# Patient Record
Sex: Male | Born: 1990 | Race: Black or African American | Hispanic: No | Marital: Single | Smoking: Current every day smoker
Health system: Southern US, Community
[De-identification: ages and names within clinical notes are randomized; demographics above are authoritative.]

## PROBLEM LIST (undated history)

## (undated) ENCOUNTER — Ambulatory Visit: Admission: EM | Payer: Self-pay | Source: Home / Self Care

---
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
## 2010-09-01 ENCOUNTER — Encounter: Payer: Self-pay | Admitting: Emergency Medicine

## 2010-12-24 ENCOUNTER — Emergency Department (HOSPITAL_COMMUNITY)
Admission: EM | Admit: 2010-12-24 | Discharge: 2010-12-24 | Disposition: A | Discharge status: Home / Self Care | Payer: Self-pay | Attending: Emergency Medicine | Admitting: Emergency Medicine

## 2010-12-24 DIAGNOSIS — J020 Streptococcal pharyngitis: Secondary | ICD-10-CM | POA: Insufficient documentation

## 2010-12-27 NOTE — Op Note (Signed)
Kenneth George, Kenneth George              ACCOUNT NO.:  [id number]   MEDICAL RECORD NO.:  [id number]          PATIENT TYPE:  INP   LOCATION:  1506                         FACILITY:  [facility]   PHYSICIAN:  Burnard Bunting, M.D.    DATE OF BIRTH:  1990/08/31   DATE OF PROCEDURE:  06/27/2005  DATE OF DISCHARGE:                                 OPERATIVE REPORT   PREOPERATIVE DIAGNOSIS:  Left complex proximal tibial plateau fracture and  growth plate fracture.   POSTOPERATIVE DIAGNOSIS:  Left complex proximal tibial plateau fracture and  growth plate fracture.   PROCEDURE:  Open reduction and internal fixation of complex bicondylar type  tibial plateau fracture.   SURGEON:  Burnard Bunting, M.D.   ASSISTANT:  Gabriel Earing, M.D.   ANESTHESIA:  General endotracheal anesthesia   ESTIMATED BLOOD LOSS:  50 mL.   DRAIN:  Hemovac x1.   TOURNIQUET TIME:  Two hours at 300 mmHg.   DESCRIPTION OF PROCEDURE:  Patient brought to the operating room where  general endotracheal anesthesia was induced.  Preoperative IV antibiotics  were administered.  Left leg was prepped with DuraPrep solution and draped  in a sterile manner.  Operative field was covered with Ioban.  Leg was  elevated and exsanguinated with esmarch wrap and tourniquet was inflated.  Anterior depression of the knee was utilized.  Anterior incision was  centered over the main fracture fragment which was palpable.  Skin and  subcutaneous tissue was sharply divided.  The knee joint was opened through  the fracture of the tibial tubercle and proximal tibial plateau.  Irrigation  was performed.  Fibrous tissue and clot was removed from the fractured  edges.  Under direct visualization, the fracture of the shaft and the main  posterior fragment was reduced and held.  A 4.5 lag screw was then placed  from distal anterior in the shaft to proximal posterior in the first  fragment.  This screw was drilled under direct visualization with good  compression achieved.  Following fixation of the shaft to the posterior  fragment, the plate was applied.  The anterior fragment was held in position  with two K-wires and the plate was applied to the reduced articular surface.  Correct reduction was confirmed in the AP and lateral planes under  fluoroscopic guidance.  Following plate fixation, the construct was found to  be stable.  Two screws were then placed through the tibial tubercle fragment  across the shaft and into the posterior fragment with good purchase  achieved.  Again, aiming toward the posterior aspect and then the screws  were placed over guidewires with the knee under fluoroscopic guidance.  A  second 4.5 lag screw was then placed from the shaft into the posterior  fragment from the anterior medial tibial shaft.  One screw was then placed  proximal to the plate across the two fragments proximally.  The entire  construct was checked under fluoroscopic guidance in the AP and lateral  planes and good reduction and safe placement of the screws was confirmed.  At this time, tourniquet was released.  Bleeding  points controlled using  electrocautery.  Soft tissue  coverage was achieved over the plate using 0 Vicryl suture.  Compartments  were soft and viable.  Skin was closed using interrupted inverted 2-0 Vicryl  sutures followed by skin staples.  Bulky dressing and knee immobilizer was  placed.  The patient tolerated the procedure well without immediate  complications.           ______________________________  G. Dorene Grebe, M.D.     GSD/MEDQ  D:  07/01/2005  T:  07/01/2005  Job:  [id number]

## 2010-12-27 NOTE — Discharge Summary (Signed)
NAMEBREKEN, Kenneth George              ACCOUNT NO.:  [id number]   MEDICAL RECORD NO.:  [id number]          PATIENT TYPE:  INP   LOCATION:  6153                         FACILITY:  [facility]   PHYSICIAN:  Orie Rout, M.D.DATE OF BIRTH:  1991-03-15   DATE OF ADMISSION:  08/24/2005  DATE OF DISCHARGE:  08/27/2005                                 DISCHARGE SUMMARY   CHIEF COMPLAINT:  Headache and fever.   HISTORY OF PRESENT ILLNESS:  The patient is a 20 year old male with a three-  day history of headache and vomiting and fever.  The patient also complained  of neck stiffness since the day before admission.  The patient went to the  ED one day prior to admission, had a head CT that was normal per the patient  and was given Motrin.  The headache improved somewhat with Motrin but then  worsened again.  The patient described the pain as bilateral at the temples,  5/10 but a size 10/10.  Denied scotoma but positive photophobia and  phonophobia as well as blurry vision.  The patient's present state of health  includes a left tib-fib fracture incurred on November 2006, that required  surgery with subsequent surface infection, being treated with ointment.  The  specifics of the infection and the treatment were unobtainable.  The patient  denied recent trauma but complained of dizziness while standing with  difficulty walking but primary because of the leg surgery.  No mental status  changes or loss of consciousness.  The patient was status post LP, lumbar  puncture, at [facility].   1.  ID.  CSF was positive for WBCs but negative for organisms on Gram stain.      Predominant lymphocytes seen on cell count were more consistent with a      viral etiology as well as the nontoxic appearance on exam of the      patient.  The patient was given ceftriaxone for broad coverage bacterial      etiologies and placed on __________  precautions.  Blood cultures were      drawn and were negative x48  hours and CSF cultures were drawn which were      also negative.  Enterovirus PCR was sent and is still pending.  HIV test      was negative.  Regarding the patient's left lower extremity  tib-fib      fracture, continued his dressing changes and orthopedic consult were      obtained.  The patient remained stable and afebrile and infectious      etiology workup was negative.  The patient was given a presumed working      diagnosis of aseptic meningitis syndrome.  In discussion with the      patient, the patient denied any high risk sexual activity or drug use.      The patient is a virgin, has had no sexual contact, and is heterosexual      in orientation.  No additional risk factors for meningeal infection      could be identified.  The patient was discharged stable  and afebrile,      able to ambulate with improved headache, responsive to Tylenol #3.  2.  Pain/neuro.  The patient's presented with significant pain on exam and      was given Tylenol #3 throughout his stay.  Changes in mental status were      not observed during the patient's stay.  The patient was discharged home      with Tylenol #3 for pain control.   FOLLOWUP ISSUES:  1.  The patient's mother stated the patient is being seen by [facility]      [facility]icine.  On contact with [facility], they said the      patient's care had been transferred to [facility] and that      the patient was no longer being seen at [facility].  [facility]      says they have never seen the patient either but nevertheless, had him      in their system and a followup appointment was made with them for the      day after admission, August 28, 2005, with Dr. Stacie Acres, at 2 p.m.  2.  Follow up continue blood cultures.  3.  Follow up CSF cultures.  4.  Follow up CSF enterovirus PCR.  5.  In addition, the patient is still recuperating from leg fracture and      home health PT/OT and CNA were also written for.   LABORATORY  VALUES:  CBC 6.2, hemoglobin 12.9, hematocrit 38.9, platelets  395, ANC 5.1, ALC 0.7.  HIV negative.  Blood cultures negative x48 hours.  CSF cultures pending.   TREATMENTS:  1.  Ceftriaxone.  2.  Motrin.  3.  Morphine.   OPERATIONS/PROCEDURES:  Head CT, August 24, 1005, normal.  No acute  intracranial pathology.   FINAL DIAGNOSES:  1.  Acute aseptic meningitis syndrome.  2.  Lymphopenia.   DISCHARGE INSTRUCTIONS:  1.  The patient is instructed to continue supportive care.  2.  Bed rest as needed.  3.  Continue home health PT, OT, and CNA.   DISCHARGE WEIGHT:  100.33 kilograms.   DISCHARGE CONDITION:  Improved.      Towana Badger, M.D.    ______________________________  Orie Rout, M.D.    JP/MEDQ  D:  08/27/2005  T:  08/27/2005  Job:  [id number]

## 2010-12-27 NOTE — Discharge Summary (Signed)
[name], [name]              ACCOUNT NO.:  [id number]   MEDICAL RECORD NO.:  [id number]          PATIENT TYPE:  INP   LOCATION:  1506                         [facility]ACILITY:  [facility]   PHYSICIAN:  Burnard Bunting, M.D.    DATE OF BIRTH:  Nov 30, 1990   DATE OF ADMISSION:  06/28/2005  DATE OF DISCHARGE:  07/02/2005                                 DISCHARGE SUMMARY   DISCHARGE DIAGNOSIS:  Left tibial plateau fracture.   OPERATION:  Left tibial plateau fracture open reduction/internal fixation  performed on June 29, 2005.   HOSPITAL COURSE:  Kenneth George is a 20 year old adult-sized patient who  has sustained a left tibial plateau fracture on June 29, 2005.  The  patient had no evidence of compartment syndrome at the time of his  admission.  He is admitted to the orthopedic service and underwent surgery  the following day.  The patient tolerated the procedure well without  immediate complication.   On postop day #1, he had intact dorsiflexion and plantar flexion sensation  to the foot as well as palpable pedal pulses.  He was started with physical  therapy for immobilization.  The patient was slow to mobilize.  His incision  was intact. On postoperative day #2, he was maintained nonweightbearing to  left lateral extremity.  He had an otherwise unremarkable recovery and was  discharged home in good condition, nonweightbearing and a knee immobilizer.  He will follow up with me in seven days for inspection of the incision.   DISCHARGE MEDICATIONS:  Percocet and Robaxin.   He was discharged in good condition.           ______________________________  G. Dorene Grebe, M.D.     GSD/MEDQ  D:  08/08/2005  T:  08/08/2005  Job:  [id number]

## 2011-05-07 LAB — POCT RAPID STREP A: Streptococcus, Group A Screen (Direct): POSITIVE — AB

## 2013-01-15 ENCOUNTER — Encounter (HOSPITAL_COMMUNITY): Payer: Self-pay | Admitting: Emergency Medicine

## 2013-01-15 ENCOUNTER — Emergency Department (HOSPITAL_COMMUNITY)
Admission: EM | Admit: 2013-01-15 | Discharge: 2013-01-15 | Disposition: A | Discharge status: Home / Self Care | Payer: Self-pay | Attending: Emergency Medicine | Admitting: Emergency Medicine

## 2013-01-15 DIAGNOSIS — F17200 Nicotine dependence, unspecified, uncomplicated: Secondary | ICD-10-CM | POA: Insufficient documentation

## 2013-01-15 DIAGNOSIS — J020 Streptococcal pharyngitis: Secondary | ICD-10-CM | POA: Insufficient documentation

## 2013-01-15 MED ORDER — IBUPROFEN 800 MG PO TABS
800.0000 mg | ORAL_TABLET | Freq: Once | ORAL | Status: AC
  Administered 2013-01-15: 800 mg via ORAL
  Filled 2013-01-15: qty 1

## 2013-01-15 MED ORDER — PENICILLIN G BENZATHINE 1200000 UNIT/2ML IM SUSP
1.2000 10*6.[IU] | Freq: Once | INTRAMUSCULAR | Status: AC
  Administered 2013-01-15: 1.2 10*6.[IU] via INTRAMUSCULAR
  Filled 2013-01-15: qty 2

## 2013-01-15 MED ORDER — METHYLPREDNISOLONE SODIUM SUCC 125 MG IJ SOLR
125.0000 mg | Freq: Once | INTRAMUSCULAR | Status: AC
  Administered 2013-01-15: 125 mg via INTRAMUSCULAR
  Filled 2013-01-15: qty 2

## 2013-01-15 NOTE — ED Notes (Addendum)
Pt states he has had a sore throat for 2 days.  Denies cough or fevers at home.  Pt alert, oriented, able to swallow.

## 2013-01-15 NOTE — ED Provider Notes (Signed)
Medical screening examination/treatment/procedure(s) were performed by non-physician practitioner and as supervising physician I was immediately available for consultation/collaboration.   Jerami Tammen, MD 01/15/13 1440 

## 2013-01-15 NOTE — ED Provider Notes (Signed)
History     CSN: [id number]  Arrival date & time 01/15/13  1003   First MD Initiated Contact with Patient 01/15/13 1039      Chief Complaint  Patient presents with  . Sore Throat    (Consider location/radiation/quality/duration/timing/severity/associated sxs/prior treatment) HPI Comments: Patient presents to the ED for throat x 2 days. Patient states it is painful for him to swallow, however he is not having any difficulty doing so.  Denies any cough, fever, sweats, or chills. No recent sick contacts. Patient notes he has had recurrent episodes of strep over the past 3 years. He has never seen ENT for this.  No chest pain, SOB, or difficulty breathing.  The history is provided by the patient.    History reviewed. No pertinent past medical history.  History reviewed. No pertinent past surgical history.  History reviewed. No pertinent family history.  History  Substance Use Topics  . Smoking status: Current Every Day Smoker  . Smokeless tobacco: Not on file  . Alcohol Use: No      Review of Systems  HENT: Positive for sore throat.   All other systems reviewed and are negative.    Allergies  Review of patient's allergies indicates not on file.  Home Medications  No current outpatient prescriptions on file.  There were no vitals taken for this visit.  Physical Exam  Nursing note and vitals reviewed. Constitutional: He is oriented to person, place, and time. He appears well-developed and well-nourished.  HENT:  Head: Normocephalic and atraumatic. No trismus in the jaw.  Mouth/Throat: Uvula is midline and mucous membranes are normal. No edematous. Oropharyngeal exudate present. No posterior oropharyngeal edema, posterior oropharyngeal erythema or tonsillar abscesses.  Tonsils swollen 2+ with exudates bilaterally, oropharynx erythematous without noted edema, airway patent, uvula midline, handling secretions appropriately  Eyes: Conjunctivae and EOM are normal. Pupils  are equal, round, and reactive to light.  Neck: Normal range of motion.  Cardiovascular: Normal rate, regular rhythm and normal heart sounds.   Pulmonary/Chest: Effort normal and breath sounds normal.  Abdominal: Soft. Bowel sounds are normal.  Musculoskeletal: Normal range of motion.  Lymphadenopathy:    He has cervical adenopathy (anterior).  Neurological: He is alert and oriented to person, place, and time.  Skin: Skin is warm and dry.  Psychiatric: He has a normal mood and affect.    ED Course  Procedures (including critical care time)  Labs Reviewed - No data to display No results found.   1. Strep pharyngitis       MDM   Pt meets Cent[facility] criteria for strep, will tx accordingly.  Pen G and solu-medrol given in ED.  Given recurrent episodes, pt will be given referral to ENT for evaluation if sx continue.  Discussed plan with pt and mother, they agreed.  Return precautions advised.        Garlon Hatchet, PA-C 01/15/13 1208

## 2013-07-11 ENCOUNTER — Other Ambulatory Visit: Payer: Self-pay | Admitting: Unknown Physician Specialty

## 2016-03-30 ENCOUNTER — Ambulatory Visit (HOSPITAL_COMMUNITY)
Admission: EM | Admit: 2016-03-30 | Discharge: 2016-03-30 | Disposition: A | Discharge status: Home / Self Care | Payer: Self-pay | Attending: Family Medicine | Admitting: Family Medicine

## 2016-03-30 ENCOUNTER — Encounter (HOSPITAL_COMMUNITY): Payer: Self-pay | Admitting: *Deleted

## 2016-03-30 DIAGNOSIS — J029 Acute pharyngitis, unspecified: Secondary | ICD-10-CM | POA: Insufficient documentation

## 2016-03-30 DIAGNOSIS — F17200 Nicotine dependence, unspecified, uncomplicated: Secondary | ICD-10-CM | POA: Insufficient documentation

## 2016-03-30 LAB — POCT RAPID STREP A: Streptococcus, Group A Screen (Direct): NEGATIVE

## 2016-03-30 MED ORDER — AMOXICILLIN 875 MG PO TABS: 875.0000 mg | ORAL_TABLET | Freq: Two times a day (BID) | ORAL | 0 refills | Status: DC

## 2016-03-30 MED ORDER — CHLORHEXIDINE GLUCONATE 0.12 % MT SOLN: 15.0000 mL | Freq: Two times a day (BID) | OROMUCOSAL | 0 refills | Status: AC

## 2016-03-30 NOTE — Discharge Instructions (Signed)
We are running a backup strep tests that should be completed by Wednesday. We'll call you and let you know whether to continue the antibiotics at that time.

## 2016-03-30 NOTE — ED Provider Notes (Signed)
[facility]    CSN: [id number] Arrival date & time: 03/30/16  [id number]  First Provider Contact:  None       History   Chief Complaint Chief Complaint  Patient presents with  . Sore Throat    HPI Kenneth George is a 25 y.o. male.   This is a 25 year old man who had a recent cold and hasn't resolved, sore throat developed in his right throat. He has had strep in the past.  Patient denies ear pain. He denies fever.  Pain is worse when he swallows.  Patient denies nausea, vomiting, cough      History reviewed. No pertinent past medical history.  There are no active problems to display for this patient.   History reviewed. No pertinent surgical history.     Home Medications    Prior to Admission medications   Medication Sig Start Date End Date Taking? Authorizing Provider  amoxicillin (AMOXIL) 875 MG tablet Take 1 tablet (875 mg total) by mouth 2 (two) times daily. 03/30/16   Kenneth SidleKurt Koraline Phillipson, MD  chlorhexidine (PERIDEX) 0.12 % solution Use as directed 15 mLs in the mouth or throat 2 (two) times daily. 03/30/16   Kenneth SidleKurt Kariann Wecker, MD    Family History History reviewed. No pertinent family history.  Social History Social History  Substance Use Topics  . Smoking status: Current Every Day Smoker  . Smokeless tobacco: Not on file  . Alcohol use No     Allergies   Review of patient's allergies indicates no known allergies.   Review of Systems Review of Systems   Physical Exam Triage Vital Signs ED Triage Vitals  Enc Vitals Group     BP 03/30/16 1945 101/71     Pulse Rate 03/30/16 1945 93     Resp 03/30/16 1945 18     Temp 03/30/16 1945 98.9 F (37.2 C)     Temp Source 03/30/16 1945 Oral     SpO2 03/30/16 1945 98 %     Weight --      Height --      Head Circumference --      Peak Flow --      Pain Score 03/30/16 1956 8     Pain Loc --      Pain Edu? --      Excl. in GC? --    No data found.   Updated Vital Signs BP 101/71 (BP  Location: Right Arm)   Pulse 93   Temp 98.9 F (37.2 C) (Oral)   Resp 18   SpO2 98%   Visual Acuity Right Eye Distance:   Left Eye Distance:   Bilateral Distance:    Right Eye Near:   Left Eye Near:    Bilateral Near:     Physical Exam  Constitutional: He appears well-developed and well-nourished.  HENT:  Head: Normocephalic.  Mouth/Throat: Oropharynx is clear and moist.  Moderate erythema on the right tonsillar pillar without exudates  Eyes: Conjunctivae and EOM are normal. Pupils are equal, round, and reactive to light.  Neck: Normal range of motion. Neck supple.  Musculoskeletal: Normal range of motion.  Lymphadenopathy:    He has cervical adenopathy.  Neurological: He is alert.  Skin: Skin is warm and dry.     UC Treatments / Results  Labs (all labs ordered are listed, but only abnormal results are displayed) Labs Reviewed  POCT RAPID STREP A    EKG  EKG Interpretation None       Radiology  No results found.  Procedures Procedures (including critical care time)  Medications Ordered in UC Medications - No data to display   Initial Impression / Assessment and Plan / UC Course  I have reviewed the triage vital signs and the nursing notes.  Pertinent labs & imaging results that were available during my care of the patient were reviewed by me and considered in my medical decision making (see chart for details).  Clinical Course     Final Clinical Impressions(s) / UC Diagnoses   Final diagnoses:  Pharyngitis    New Prescriptions New Prescriptions   AMOXICILLIN (AMOXIL) 875 MG TABLET    Take 1 tablet (875 mg total) by mouth 2 (two) times daily.   CHLORHEXIDINE (PERIDEX) 0.12 % SOLUTION    Use as directed 15 mLs in the mouth or throat 2 (two) times daily.     Kenneth SidleKurt Jarrel Knoke, MD 03/30/16 2015

## 2016-03-30 NOTE — ED Triage Notes (Signed)
Pt  Reports  sorethroat      Pain  r  Side  Throat  When he  Swallows        Pt reports he  Has   Been taking  otc  meds   Without  releif

## 2016-04-01 LAB — CULTURE, GROUP A STREP (THRC)

## 2021-07-19 ENCOUNTER — Other Ambulatory Visit: Payer: Self-pay

## 2021-07-19 ENCOUNTER — Ambulatory Visit
Admission: EM | Admit: 2021-07-19 | Discharge: 2021-07-19 | Disposition: A | Discharge status: Home / Self Care | Payer: Self-pay | Attending: Emergency Medicine | Admitting: Emergency Medicine

## 2021-07-19 ENCOUNTER — Encounter: Payer: Self-pay | Admitting: Emergency Medicine

## 2021-07-19 DIAGNOSIS — R369 Urethral discharge, unspecified: Secondary | ICD-10-CM | POA: Insufficient documentation

## 2021-07-19 DIAGNOSIS — Z202 Contact with and (suspected) exposure to infections with a predominantly sexual mode of transmission: Secondary | ICD-10-CM | POA: Insufficient documentation

## 2021-07-19 LAB — POCT URINALYSIS DIP (MANUAL ENTRY)
Bilirubin, UA: NEGATIVE
Glucose, UA: NEGATIVE mg/dL
Ketones, POC UA: NEGATIVE mg/dL
Nitrite, UA: NEGATIVE
Spec Grav, UA: >=1.03 — AB (ref 1.010–1.025)
Urobilinogen, UA: 0.2 E.U./dL
pH, UA: 6 (ref 5.0–8.0)

## 2021-07-19 MED ORDER — DOXYCYCLINE HYCLATE 100 MG PO CAPS: 100.0000 mg | ORAL_CAPSULE | Freq: Two times a day (BID) | ORAL | 0 refills | Status: AC

## 2021-07-19 NOTE — ED Triage Notes (Signed)
Patient presents to [facility] for evaluation of three days of penile discharge, originally white, now turning yellow.  C/o pain with urination.  Denies testicular pain.

## 2021-07-19 NOTE — Discharge Instructions (Signed)
Thank you for visiting urgent care today.  I have sent a prescription for doxycycline to your pharmacy to treat you for suspected chlamydia.  I do not believe there is a reason for you to wait 3 to 5 days for the results of your STD test to start treatment.  As we discussed, if you take doxycycline and you do not have chlamydia there is no harm done.  If your STD screening comes back positive for either gonorrhea or Trichomonas, we will treat you for those as well.  Positive results are always called to the patient, negative results will be posted to your MyChart.  If you have not signed up for MyChart, please feel free to do so before you leave today.

## 2021-07-19 NOTE — ED Provider Notes (Signed)
[facility]    CSN: [id number] Arrival date & time: 07/19/21  0808    HISTORY   Chief Complaint  Patient presents with   Penile Discharge   HPI Kenneth George is a 30 y.o. male. Patient presents to [facility] for evaluation of three days of penile discharge, originally white, now turning yellow.  C/o mild burning with urination but this does not occur every time he urinates.  Denies testicular pain.  Patient denies known exposure to sexually transmitted disease but adds that at this time he is currently questioning 1 particular partner who is not advised him that she has had any symptoms or tested positive for anything.  Patient denies genital lesion, fever, aches, chills, blood in pedal discharge.  The history is provided by the patient.  History reviewed. No pertinent past medical history. There are no problems to display for this patient.  History reviewed. No pertinent surgical history.  Home Medications    Prior to Admission medications   Medication Sig Start Date End Date Taking? Authorizing Provider  chlorhexidine (PERIDEX) 0.12 % solution Use as directed 15 mLs in the mouth or throat 2 (two) times daily. 03/30/16   Elvina Sidle, MD   Family History Family History  Problem Relation Age of Onset   Diabetes Mother    Social History Social History   Tobacco Use   Smoking status: Every Day  Substance Use Topics   Alcohol use: No   Drug use: Yes    Types: Marijuana   Allergies   Patient has no known allergies.  Review of Systems Review of Systems Pertinent findings noted in history of present illness.   Physical Exam Triage Vital Signs ED Triage Vitals  Enc Vitals Group     BP 06/07/21 0827 (!) 147/82     Pulse Rate 06/07/21 0827 72     Resp 06/07/21 0827 18     Temp 06/07/21 0827 98.3 F (36.8 C)     Temp Source 06/07/21 0827 Oral     SpO2 06/07/21 0827 98 %     Weight --      Height --      Head Circumference --      Peak Flow --      Pain  Score 06/07/21 0826 5     Pain Loc --      Pain Edu? --      Excl. in GC? --   No data found.  Updated Vital Signs BP (!) 143/84 (BP Location: Right Arm)   Pulse 83   Temp 98.7 F (37.1 C) (Oral)   Resp 18   SpO2 96%   Physical Exam Vitals and nursing note reviewed.  Constitutional:      General: He is not in acute distress.    Appearance: Normal appearance. He is not ill-appearing.  HENT:     Head: Normocephalic and atraumatic.  Eyes:     General: Lids are normal.        Right eye: No discharge.        Left eye: No discharge.     Extraocular Movements: Extraocular movements intact.     Conjunctiva/sclera: Conjunctivae normal.     Right eye: Right conjunctiva is not injected.     Left eye: Left conjunctiva is not injected.  Neck:     Trachea: Trachea and phonation normal.  Cardiovascular:     Rate and Rhythm: Normal rate and regular rhythm.     Pulses: Normal pulses.  Heart sounds: Normal heart sounds. No murmur heard.   No friction rub. No gallop.  Pulmonary:     Effort: Pulmonary effort is normal. No accessory muscle usage, prolonged expiration or respiratory distress.     Breath sounds: Normal breath sounds. No stridor, decreased air movement or transmitted upper airway sounds. No decreased breath sounds, wheezing, rhonchi or rales.  Chest:     Chest wall: No tenderness.  Genitourinary:    Comments: Pt politely declines GU exam, pt did provide a swab for testing.   Musculoskeletal:        General: Normal range of motion.     Cervical back: Normal range of motion and neck supple. Normal range of motion.  Lymphadenopathy:     Cervical: No cervical adenopathy.  Skin:    General: Skin is warm and dry.     Findings: No erythema or rash.  Neurological:     General: No focal deficit present.     Mental Status: He is alert and oriented to person, place, and time.  Psychiatric:        Mood and Affect: Mood normal.        Behavior: Behavior normal.    Visual  Acuity Right Eye Distance:   Left Eye Distance:   Bilateral Distance:    Right Eye Near:   Left Eye Near:    Bilateral Near:     UC Couse / Diagnostics / Procedures:    EKG  Radiology No results found.  Procedures Procedures (including critical care time)  UC Diagnoses / Final Clinical Impressions(s)   I have reviewed the triage vital signs and the nursing notes.  Pertinent labs & imaging results that were available during my care of the patient were reviewed by me and considered in my medical decision making (see chart for details).   Final diagnoses:  Penile discharge  Possible exposure to STD   Based on symptoms described by patient, I feel it is appropriate to treat him empirically for presumed chlamydia.  Patient advised results of STD testing will be made available to him once complete, that would be posted to his MyChart and if there are any positive findings he will receive a phone call with further instructions regarding treatment.  ED Prescriptions     Medication Sig Dispense Auth. Provider   doxycycline (VIBRAMYCIN) 100 MG capsule Take 1 capsule (100 mg total) by mouth 2 (two) times daily for 7 days. 14 capsule Theadora Rama Scales, PA-C      PDMP not reviewed this encounter.  Pending results:  Labs Reviewed  POCT URINALYSIS DIP (MANUAL ENTRY) - Abnormal; Notable for the following components:      Result Value   Clarity, UA cloudy (*)    Spec Grav, UA >=1.030 (*)    Blood, UA small (*)    Protein Ur, POC trace (*)    Leukocytes, UA Moderate (2+) (*)    All other components within normal limits  CYTOLOGY, (ORAL, ANAL, URETHRAL) ANCILLARY ONLY    Medications Ordered in UC: Medications - No data to display  Disposition Upon Discharge:  Condition: stable for discharge home  Patient presents today with concerns for exposure to sexually transmitted disease, requesting testing.  STD screening was performed as indicated.  Patient has been advised that the  results of screening will be made available to them via MyChart and, if there are any positive findings, they will be contacted by phone, recommendations for treatment will be advised and prescriptions will be  provided as indicated based on clinical guidelines.  Patient has also been advised that if treatment is recommended, they should abstain from sexual intercourse of all forms until treatment is complete.  Patient has further been advised that once treatment is complete, they have not had a complete resolution of their symptoms, if any, they should continue to abstain from sexual intercourse with all forms and follow-up with her primary care provider or return to urgent care for repeat testing.  As such, the patient has been evaluated and assessed, work-up was performed and treatment was provided in alignment with urgent care protocols and evidence based medicine.  Patient/parent/caregiver has been advised that the patient may require follow up for further testing and/or treatment if the symptoms continue in spite of treatment, as clinically indicated and appropriate.  Routine symptom specific, illness specific and/or disease specific instructions were discussed with the patient and/or caregiver at length.  Prevention strategies for avoiding STD exposure were also discussed.  The patient will follow up with their current PCP if and as advised. If the patient does not currently have a PCP we will assist them in obtaining one.   The patient may need specialty follow up if the symptoms continue, in spite of conservative treatment and management, for further workup, evaluation, consultation and treatment as clinically indicated and appropriate.  Patient/parent/caregiver verbalized understanding and agreement of plan as discussed.  All questions were addressed during visit.  Please see discharge instructions below for further details of plan.  Discharge Instructions:   Discharge Instructions       Thank you for visiting urgent care today.  I have sent a prescription for doxycycline to your pharmacy to treat you for suspected chlamydia.  I do not believe there is a reason for you to wait 3 to 5 days for the results of your STD test to start treatment.  As we discussed, if you take doxycycline and you do not have chlamydia there is no harm done.  If your STD screening comes back positive for either gonorrhea or Trichomonas, we will treat you for those as well.  Positive results are always called to the patient, negative results will be posted to your MyChart.  If you have not signed up for MyChart, please feel free to do so before you leave today.         Theadora Rama Scales, PA-C 07/19/21 1415

## 2021-07-22 LAB — CYTOLOGY, (ORAL, ANAL, URETHRAL) ANCILLARY ONLY
Chlamydia: NEGATIVE
Comment: NEGATIVE
Comment: NEGATIVE
Comment: NORMAL
Neisseria Gonorrhea: POSITIVE — AB
Trichomonas: NEGATIVE

## 2021-07-23 ENCOUNTER — Ambulatory Visit
Admission: EM | Admit: 2021-07-23 | Discharge: 2021-07-23 | Disposition: A | Discharge status: Home / Self Care | Payer: Self-pay | Attending: Internal Medicine | Admitting: Internal Medicine

## 2021-07-23 ENCOUNTER — Other Ambulatory Visit: Payer: Self-pay

## 2021-07-23 DIAGNOSIS — A549 Gonococcal infection, unspecified: Secondary | ICD-10-CM

## 2021-07-23 MED ORDER — CEFTRIAXONE SODIUM 250 MG IJ SOLR
500.0000 mg | Freq: Once | INTRAMUSCULAR | Status: AC
  Administered 2021-07-23: 500 mg via INTRAMUSCULAR

## 2021-07-23 NOTE — ED Triage Notes (Signed)
Pt returned to be treated for his positive labs.

## 2023-04-14 ENCOUNTER — Ambulatory Visit
Admission: RE | Admit: 2023-04-14 | Discharge: 2023-04-14 | Disposition: A | Discharge status: Home / Self Care | Payer: BC Managed Care – PPO | Source: Ambulatory Visit | Attending: Internal Medicine | Admitting: Internal Medicine

## 2023-04-14 VITALS — BP 128/82 | HR 64 | Temp 98.3°F | Resp 17

## 2023-04-14 DIAGNOSIS — J029 Acute pharyngitis, unspecified: Secondary | ICD-10-CM | POA: Insufficient documentation

## 2023-04-14 DIAGNOSIS — Z1152 Encounter for screening for COVID-19: Secondary | ICD-10-CM | POA: Insufficient documentation

## 2023-04-14 LAB — POCT RAPID STREP A (OFFICE): Rapid Strep A Screen: NEGATIVE

## 2023-04-14 MED ORDER — AMOXICILLIN 500 MG PO CAPS
500.0000 mg | ORAL_CAPSULE | Freq: Two times a day (BID) | ORAL | 0 refills | Status: AC
Start: 2023-04-14 — End: 2023-04-24

## 2023-04-14 NOTE — ED Triage Notes (Signed)
Pt presents with a sore throat, and pain when talking or swallowing  x 4 days. Pt states the back of his throat is inflamed and has white spots.

## 2023-04-14 NOTE — ED Provider Notes (Signed)
[facility]    CSN: [id number] Arrival date & time: 04/14/23  1453      History   Chief Complaint Chief Complaint  Patient presents with   Sore Throat    -Persistent sore throat over 4 days- tonsils red - white substance in the back of my throat- feels swollen - Entered by patient    HPI Kenneth George is a 32 y.o. male  presents for evaluation of URI symptoms for 4 days. Patient reports associated symptoms of sore throat, chills. Denies N/V/D, cough, congestion, fevers, ear pain, body aches, shortness of breath. Patient does not have a hx of asthma or smoking. No known sick contacts.  Pt has taken tea and honey OTC for symptoms. Pt has no other concerns at this time.    Sore Throat    History reviewed. No pertinent past medical history.  There are no problems to display for this patient.   History reviewed. No pertinent surgical history.     Home Medications    Prior to Admission medications   Medication Sig Start Date End Date Taking? Authorizing Provider  amoxicillin (AMOXIL) 500 MG capsule Take 1 capsule (500 mg total) by mouth 2 (two) times daily for 10 days. 04/14/23 04/24/23 Yes Radford Pax, NP  chlorhexidine (PERIDEX) 0.12 % solution Use as directed 15 mLs in the mouth or throat 2 (two) times daily. 03/30/16   Elvina Sidle, MD    Family History Family History  Problem Relation Age of Onset   Diabetes Mother     Social History Social History   Tobacco Use   Smoking status: Every Day  Substance Use Topics   Alcohol use: No   Drug use: Yes    Types: Marijuana     Allergies   Patient has no known allergies.   Review of Systems Review of Systems  Constitutional:  Positive for chills.  HENT:  Positive for sore throat.      Physical Exam Triage Vital Signs ED Triage Vitals [04/14/23 1628]  Encounter Vitals Group     BP 128/82     Systolic BP Percentile      Diastolic BP Percentile      Pulse Rate 64     Resp 17     Temp  98.3 F (36.8 C)     Temp Source Oral     SpO2 97 %     Weight      Height      Head Circumference      Peak Flow      Pain Score 4     Pain Loc      Pain Education      Exclude from Growth Chart    No data found.  Updated Vital Signs BP 128/82 (BP Location: Right Arm)   Pulse 64   Temp 98.3 F (36.8 C) (Oral)   Resp 17   SpO2 97%   Visual Acuity Right Eye Distance:   Left Eye Distance:   Bilateral Distance:    Right Eye Near:   Left Eye Near:    Bilateral Near:     Physical Exam Vitals and nursing note reviewed.  Constitutional:      General: He is not in acute distress.    Appearance: Normal appearance. He is not ill-appearing or toxic-appearing.  HENT:     Head: Normocephalic and atraumatic.     Right Ear: Tympanic membrane and ear canal normal.     Left Ear: Tympanic membrane  and ear canal normal.     Nose: Congestion present.     Mouth/Throat:     Mouth: Mucous membranes are moist.     Pharynx: Posterior oropharyngeal erythema present.  Eyes:     Pupils: Pupils are equal, round, and reactive to light.  Cardiovascular:     Rate and Rhythm: Normal rate and regular rhythm.     Heart sounds: Normal heart sounds.  Pulmonary:     Effort: Pulmonary effort is normal.     Breath sounds: Normal breath sounds.  Musculoskeletal:     Cervical back: Normal range of motion and neck supple.  Lymphadenopathy:     Cervical: No cervical adenopathy.  Skin:    General: Skin is warm and dry.  Neurological:     General: No focal deficit present.     Mental Status: He is alert and oriented to person, place, and time.  Psychiatric:        Mood and Affect: Mood normal.        Behavior: Behavior normal.      UC Treatments / Results  Labs (all labs ordered are listed, but only abnormal results are displayed) Labs Reviewed  CULTURE, GROUP A STREP ([facility])  SARS CORONAVIRUS 2 (TAT 6-24 HRS)  POCT RAPID STREP A (OFFICE)    EKG   Radiology No results  found.  Procedures Procedures (including critical care time)  Medications Ordered in UC Medications - No data to display  Initial Impression / Assessment and Plan / UC Course  I have reviewed the triage vital signs and the nursing notes.  Pertinent labs & imaging results that were available during my care of the patient were reviewed by me and considered in my medical decision making (see chart for details).     Reviewed exam and symptoms with patient.  No red flags.  Negative rapid strep, will culture.  COVID PCR and will contact if positive.  Given symptoms and presentation will start amoxicillin while awaiting throat culture results.  If negative will stop.  Otherwise discussed Tylenol/ibuprofen and salt water gargles and warm liquids.  PCP follow-up 2 days for recheck.  ER precautions reviewed and patient verbalized understanding. Final Clinical Impressions(s) / UC Diagnoses   Final diagnoses:  Sore throat  Acute pharyngitis, unspecified etiology     Discharge Instructions      The clinical contact you with results of the COVID test and throat culture done today if positive.  Start amoxicillin twice daily for 10 days.  Lots of rest and fluids.  Ibuprofen and Tylenol as needed.  Salt water gargles and warm liquids.  Follow-up with your PCP 2 days for recheck.  I hope you feel better soon!    ED Prescriptions     Medication Sig Dispense Auth. Provider   amoxicillin (AMOXIL) 500 MG capsule Take 1 capsule (500 mg total) by mouth 2 (two) times daily for 10 days. 20 capsule Radford Pax, NP      PDMP not reviewed this encounter.   Radford Pax, NP 04/14/23 [phone]

## 2023-04-14 NOTE — Discharge Instructions (Signed)
The clinical contact you with results of the COVID test and throat culture done today if positive.  Start amoxicillin twice daily for 10 days.  Lots of rest and fluids.  Ibuprofen and Tylenol as needed.  Salt water gargles and warm liquids.  Follow-up with your PCP 2 days for recheck.  I hope you feel better soon!

## 2023-04-15 LAB — CULTURE, GROUP A STREP (THRC)

## 2023-04-15 LAB — SARS CORONAVIRUS 2 (TAT 6-24 HRS): SARS Coronavirus 2: NEGATIVE
